# Patient Record
Sex: Male | Born: 2005 | Race: Black or African American | Hispanic: No | Marital: Single | Smoking: Never smoker
Health system: Southern US, Community
[De-identification: ages and names within clinical notes are randomized; demographics above are authoritative.]

---
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
## 2023-01-22 ENCOUNTER — Encounter: Payer: Self-pay | Admitting: Gastroenterology

## 2023-01-22 ENCOUNTER — Ambulatory Visit (INDEPENDENT_AMBULATORY_CARE_PROVIDER_SITE_OTHER): Payer: Medicaid Other | Admitting: Gastroenterology

## 2023-01-22 VITALS — BP 131/61 | HR 61 | Temp 97.9°F | Ht 66.0 in | Wt 136.5 lb

## 2023-01-22 DIAGNOSIS — K625 Hemorrhage of anus and rectum: Secondary | ICD-10-CM | POA: Diagnosis not present

## 2023-01-22 NOTE — Progress Notes (Signed)
Connor Darby, MD [address]  [place]  [place], [place] [address]  Main: [phone]  Fax: [phone]    Gastroenterology Consultation  Referring Provider:     Pediatrics, [place] Primary Care Physician:  Pediatrics, [place] Primary Gastroenterologist:  Dr. Cephas Mejia Reason for Consultation: Rectal bleeding        HPI:   Connor Mejia is a 17 y.o. male referred by Pediatrics, [facility]  for consultation & management of rectal bleeding.  Patient reports that he noticed scant amount of bright red blood per rectum on wiping and surface of the stool that was intermittent and lasted for 2 weeks.  This was in February.  He does not have any further episodes of rectal bleeding.  He also reports that he was suffering from constipation.  He incorporated more fiber in his diet along with plenty of water and currently his constipation is manageable.  He does not have any other concerns today including rectal pain or discomfort, pressure or prolapse or weight loss or loss of appetite or perianal itching or perianal lesions.  He denies any significant straining.  He noticed the symptoms flared up during his track practice.  Never had similar episodes.  He does not smoke or drink alcohol No known history of anemia No known history of GI malignancy  NSAIDs: None  Antiplts/Anticoagulants/Anti thrombotics: None  GI Procedures: None  History reviewed. No pertinent past medical history.  History reviewed. No pertinent surgical history.  No current outpatient medications on file.  No family history on file.   Social History   Tobacco Use   Smoking status: Never   Smokeless tobacco: Never  Substance Use Topics   Alcohol use: No    Allergies as of 01/22/2023   (No Known Allergies)    Review of Systems:    All systems reviewed and negative except where noted in HPI.   Physical Exam:  BP 131/61 (BP Location: Left Arm, Patient Position: Sitting, Cuff Size: Normal)    Pulse 61   Temp 97.9 F (36.6 C) (Oral)   Ht '5\' 6"'$  (1.676 m)   Wt 136 lb 8 oz (61.9 kg)   BMI 22.03 kg/m  No LMP for male patient.  General:   Alert,  Well-developed, well-nourished, pleasant and cooperative in NAD Head:  Normocephalic and atraumatic. Eyes:  Sclera clear, no icterus.   Conjunctiva pink. Ears:  Normal auditory acuity. Nose:  No deformity, discharge, or lesions. Mouth:  No deformity or lesions,oropharynx pink & moist. Neck:  Supple; no masses or thyromegaly. Lungs:  Respirations even and unlabored.  Clear throughout to auscultation.   No wheezes, crackles, or rhonchi. No acute distress. Heart:  Regular rate and rhythm; no murmurs, clicks, rubs, or gallops. Abdomen:  Normal bowel sounds. Soft, non-tender and non-distended without masses, hepatosplenomegaly or hernias noted.  No guarding or rebound tenderness.   Rectal: Not performed Msk:  Symmetrical without gross deformities. Good, equal movement & strength bilaterally. Pulses:  Normal pulses noted. Extremities:  No clubbing or edema.  No cyanosis. Neurologic:  Alert and oriented x3;  grossly normal neurologically. Skin:  Intact without significant lesions or rashes. No jaundice. Psych:  Alert and cooperative. Normal mood and affect.  Imaging Studies: None  Assessment and Plan:   Connor Mejia is a 17 y.o. pleasant African-American male who is otherwise healthy is seen in consultation for 2 weeks history of self-limited episodes of scant bright red blood per rectum which has spontaneously resolved.  This occurred in setting of hard bowel movements.  Most likely bleeding from hemorrhoids.  With no other associated symptoms, and self-limited episode, I do not recommend any further evaluation unless he develops recurrence of symptoms.  Gave him my office number to call our office back if his symptoms recur. Discussed about high-fiber diet, adequate intake of water along with high-protein diet to keep his bowels regular  and to avoid straining Patient expressed understanding of the plan and he felt reassured by the end of the visit   Follow up as needed   Connor Darby, MD
